# Patient Record
Sex: Male | Born: 2000 | Race: Black or African American | Hispanic: No | Marital: Single | Smoking: Never smoker
Health system: Southern US, Community
[De-identification: ages and names within clinical notes are randomized; demographics above are authoritative.]

---
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
## 2023-11-01 ENCOUNTER — Ambulatory Visit (HOSPITAL_COMMUNITY)
Admission: EM | Admit: 2023-11-01 | Discharge: 2023-11-01 | Disposition: A | Discharge status: Home / Self Care | Payer: Medicaid Other | Attending: Emergency Medicine | Admitting: Emergency Medicine

## 2023-11-01 ENCOUNTER — Encounter (HOSPITAL_COMMUNITY): Payer: Self-pay

## 2023-11-01 DIAGNOSIS — R062 Wheezing: Secondary | ICD-10-CM | POA: Diagnosis not present

## 2023-11-01 DIAGNOSIS — J111 Influenza due to unidentified influenza virus with other respiratory manifestations: Secondary | ICD-10-CM

## 2023-11-01 MED ORDER — IBUPROFEN 800 MG PO TABS: 800.0000 mg | ORAL_TABLET | Freq: Three times a day (TID) | ORAL | 0 refills | Status: AC

## 2023-11-01 MED ORDER — ALBUTEROL SULFATE HFA 108 (90 BASE) MCG/ACT IN AERS: 1.0000 | INHALATION_SPRAY | Freq: Four times a day (QID) | RESPIRATORY_TRACT | 0 refills | Status: AC | PRN

## 2023-11-01 MED ORDER — PREDNISONE 20 MG PO TABS: 40.0000 mg | ORAL_TABLET | Freq: Every day | ORAL | 0 refills | Status: AC

## 2023-11-01 NOTE — ED Triage Notes (Signed)
Patient presents to the office for cough,fever and sore throat x 2 days.

## 2023-11-01 NOTE — ED Provider Notes (Signed)
[facility]    CSN: [id number] Arrival date & time: 11/01/23  0807      History   Chief Complaint Chief Complaint  Patient presents with   Cough   Nasal Congestion   Fever    HPI Norman Hubbard is a 23 y.o. male.   Patient presents to clinic for complaints of a productive cough, fever, wheezing, shortness of breath, sore throat, headache and generalized bodyaches that have been present since Tuesday or Wednesday.  He has had some recent sick contacts and is suspicious that he has influenza.  Subjective fever, hot and cold chills, has not measured temperature at home.  Has been taking Tylenol, Mucinex and over-the-counter interventions with some relief.  He is fatigued and feels very tired.  No history of asthma. No N/V/D.   The history is provided by the patient and medical records.  Cough Fever   History reviewed. No pertinent past medical history.  There are no active problems to display for this patient.   History reviewed. No pertinent surgical history.     Home Medications    Prior to Admission medications   Medication Sig Start Date End Date Taking? Authorizing Provider  albuterol (VENTOLIN HFA) 108 (90 Base) MCG/ACT inhaler Inhale 1-2 puffs into the lungs every 6 (six) hours as needed for wheezing or shortness of breath. 11/01/23  Yes Rinaldo Ratel, [place] N, FNP  ibuprofen (ADVIL) 800 MG tablet Take 1 tablet (800 mg total) by mouth 3 (three) times daily. 11/01/23  Yes Rinaldo Ratel, [place] N, FNP  predniSONE (DELTASONE) 20 MG tablet Take 2 tablets (40 mg total) by mouth daily for 5 days. 11/01/23 11/06/23 Yes Rinaldo Ratel, [place] N, FNP  Aspirin-Acetaminophen-Caffeine (GOODY HEADACHE PO) Take 1 packet by mouth daily as needed (head ache).    [provider]  ranitidine (ZANTAC) 150 MG capsule Take 1 capsule (150 mg total) by mouth daily. 09/12/15   Tilden Fossa, MD    Family History History reviewed. No pertinent family history.  Social  History Social History   Tobacco Use   Smoking status: Never  Substance Use Topics   Alcohol use: No   Drug use: No     Allergies   Patient has no known allergies.   Review of Systems Review of Systems  Per HPI   Physical Exam Triage Vital Signs ED Triage Vitals [11/01/23 0835]  Encounter Vitals Group     BP 134/80     Systolic BP Percentile      Diastolic BP Percentile      Pulse Rate 98     Resp 18     Temp 99.7 F (37.6 C)     Temp Source Oral     SpO2 98 %     Weight      Height      Head Circumference      Peak Flow      Pain Score      Pain Loc      Pain Education      Exclude from Growth Chart    No data found.  Updated Vital Signs BP 134/80 (BP Location: Left Arm)   Pulse 98   Temp 99.7 F (37.6 C) (Oral)   Resp 18   SpO2 98%   Visual Acuity Right Eye Distance:   Left Eye Distance:   Bilateral Distance:    Right Eye Near:   Left Eye Near:    Bilateral Near:     Physical Exam Vitals and nursing  note reviewed.  Constitutional:      Appearance: Normal appearance.  HENT:     Head: Normocephalic and atraumatic.     Right Ear: External ear normal.     Left Ear: External ear normal.     Nose: Congestion and rhinorrhea present.     Mouth/Throat:     Mouth: Mucous membranes are moist.  Eyes:     Conjunctiva/sclera: Conjunctivae normal.  Cardiovascular:     Rate and Rhythm: Normal rate and regular rhythm.     Heart sounds: Normal heart sounds. No murmur heard. Pulmonary:     Breath sounds: Wheezing and rhonchi present.  Musculoskeletal:        General: Normal range of motion.  Skin:    General: Skin is warm and dry.  Neurological:     General: No focal deficit present.     Mental Status: He is alert and oriented to person, place, and time.  Psychiatric:        Mood and Affect: Mood normal.        Behavior: Behavior normal.      UC Treatments / Results  Labs (all labs ordered are listed, but only abnormal results are  displayed) Labs Reviewed - No data to display  EKG   Radiology No results found.  Procedures Procedures (including critical care time)  Medications Ordered in UC Medications - No data to display  Initial Impression / Assessment and Plan / UC Course  I have reviewed the triage vital signs and the nursing notes.  Pertinent labs & imaging results that were available during my care of the patient were reviewed by me and considered in my medical decision making (see chart for details).  Vitals and triage reviewed, patient is hemodynamically stable.  Congestion and rhinorrhea present on physical exam.  Heart with regular rate and rhythm, expiratory wheezing and some rhonchi in all lobes.  No acute distress, able to speak in full sentences.  No history of asthma.  Will cover with albuterol and steroids for wheezing, suspect viral URI, most likely influenza. Outside window for tamiflu, symptomatic management encouraged. POC, f/u care and return precautions given, no questions at this time.      Final Clinical Impressions(s) / UC Diagnoses   Final diagnoses:  Influenza-like illness  Wheezing     Discharge Instructions      You most likely have the flu.  Please rest, stay well-hydrated with at least 64 ounces of water and alternate between 100 mg of ibuprofen and 500 mg of Tylenol every 4-6 hours for fever, body aches and chills.  Over-the-counter cough drops and cough syrup can help control your cough.  Use the albuterol inhaler as needed for any wheezing or shortness of breath.  Start taking the prednisone today with breakfast for the next 5 days to help with your wheezing.  Your symptoms should improve over the next 5 days, if no improvement or any changes please follow-up with your primary care provider or return to clinic for reevaluation.     ED Prescriptions     Medication Sig Dispense Auth. Provider   albuterol (VENTOLIN HFA) 108 (90 Base) MCG/ACT inhaler Inhale 1-2 puffs  into the lungs every 6 (six) hours as needed for wheezing or shortness of breath. 18 g Rinaldo Ratel, [place] N, [place]   predniSONE (DELTASONE) 20 MG tablet Take 2 tablets (40 mg total) by mouth daily for 5 days. 10 tablet Rinaldo Ratel, [place], [place]   ibuprofen (ADVIL) 800 MG tablet Take  1 tablet (800 mg total) by mouth 3 (three) times daily. 21 tablet Misako Roeder, [place] N, [place]      PDMP not reviewed this encounter.   Rinaldo Ratel [place] N, [place] 11/01/23 [phone]

## 2023-11-01 NOTE — Discharge Instructions (Signed)
You most likely have the flu.  Please rest, stay well-hydrated with at least 64 ounces of water and alternate between 100 mg of ibuprofen and 500 mg of Tylenol every 4-6 hours for fever, body aches and chills.  Over-the-counter cough drops and cough syrup can help control your cough.  Use the albuterol inhaler as needed for any wheezing or shortness of breath.  Start taking the prednisone today with breakfast for the next 5 days to help with your wheezing.  Your symptoms should improve over the next 5 days, if no improvement or any changes please follow-up with your primary care provider or return to clinic for reevaluation.
